# Patient Record
Sex: Male | Born: 2000 | Race: White | Hispanic: Yes | Marital: Single | Smoking: Never smoker
Health system: Southern US, Community
[De-identification: ages and names within clinical notes are randomized; demographics above are authoritative.]

---

## 2020-06-13 ENCOUNTER — Encounter (HOSPITAL_COMMUNITY): Payer: Self-pay | Admitting: *Deleted

## 2020-06-13 ENCOUNTER — Emergency Department (HOSPITAL_COMMUNITY): Payer: Medicaid Other

## 2020-06-13 ENCOUNTER — Emergency Department (HOSPITAL_COMMUNITY)
Admission: EM | Admit: 2020-06-13 | Discharge: 2020-06-13 | Disposition: A | Discharge status: Home / Self Care | Payer: Medicaid Other | Attending: Emergency Medicine | Admitting: Emergency Medicine

## 2020-06-13 DIAGNOSIS — R0789 Other chest pain: Secondary | ICD-10-CM

## 2020-06-13 DIAGNOSIS — X58XXXA Exposure to other specified factors, initial encounter: Secondary | ICD-10-CM | POA: Insufficient documentation

## 2020-06-13 DIAGNOSIS — S2232XA Fracture of one rib, left side, initial encounter for closed fracture: Secondary | ICD-10-CM | POA: Insufficient documentation

## 2020-06-13 DIAGNOSIS — R1012 Left upper quadrant pain: Secondary | ICD-10-CM | POA: Insufficient documentation

## 2020-06-13 DIAGNOSIS — S299XXA Unspecified injury of thorax, initial encounter: Secondary | ICD-10-CM | POA: Diagnosis present

## 2020-06-13 LAB — CBC WITH DIFFERENTIAL/PLATELET
Abs Immature Granulocytes: 0.02 10*3/uL (ref 0.00–0.07)
Basophils Absolute: 0 10*3/uL (ref 0.0–0.1)
Basophils Relative: 1 %
Eosinophils Absolute: 0.1 10*3/uL (ref 0.0–0.5)
Eosinophils Relative: 2 %
HCT: 48.1 % (ref 39.0–52.0)
Hemoglobin: 16.2 g/dL (ref 13.0–17.0)
Immature Granulocytes: 0 %
Lymphocytes Relative: 36 %
Lymphs Abs: 2.3 10*3/uL (ref 0.7–4.0)
MCH: 31.5 pg (ref 26.0–34.0)
MCHC: 33.7 g/dL (ref 30.0–36.0)
MCV: 93.6 fL (ref 80.0–100.0)
Monocytes Absolute: 0.5 10*3/uL (ref 0.1–1.0)
Monocytes Relative: 8 %
Neutro Abs: 3.3 10*3/uL (ref 1.7–7.7)
Neutrophils Relative %: 53 %
Platelets: 293 10*3/uL (ref 150–400)
RBC: 5.14 MIL/uL (ref 4.22–5.81)
RDW: 12.7 % (ref 11.5–15.5)
WBC: 6.3 10*3/uL (ref 4.0–10.5)
nRBC: 0 % (ref 0.0–0.2)

## 2020-06-13 LAB — COMPREHENSIVE METABOLIC PANEL
ALT: 14 U/L (ref 0–44)
AST: 18 U/L (ref 15–41)
Albumin: 4.9 g/dL (ref 3.5–5.0)
Alkaline Phosphatase: 94 U/L (ref 38–126)
Anion gap: 10 (ref 5–15)
BUN: 11 mg/dL (ref 6–20)
CO2: 26 mmol/L (ref 22–32)
Calcium: 9.4 mg/dL (ref 8.9–10.3)
Chloride: 103 mmol/L (ref 98–111)
Creatinine, Ser: 0.77 mg/dL (ref 0.61–1.24)
GFR, Estimated: >60 mL/min (ref >60)
Glucose, Bld: 62 mg/dL — ABNORMAL LOW (ref 70–99)
Potassium: 3.9 mmol/L (ref 3.5–5.1)
Sodium: 139 mmol/L (ref 135–145)
Total Bilirubin: 0.5 mg/dL (ref 0.3–1.2)
Total Protein: 8.7 g/dL — ABNORMAL HIGH (ref 6.5–8.1)

## 2020-06-13 LAB — LIPASE, BLOOD: Lipase: 45 U/L (ref 11–51)

## 2020-06-13 LAB — URINALYSIS, ROUTINE W REFLEX MICROSCOPIC
Bilirubin Urine: NEGATIVE
Glucose, UA: NEGATIVE mg/dL
Hgb urine dipstick: NEGATIVE
Ketones, ur: NEGATIVE mg/dL
Leukocytes,Ua: NEGATIVE
Nitrite: NEGATIVE
Protein, ur: NEGATIVE mg/dL
Specific Gravity, Urine: 1.025 (ref 1.005–1.030)
pH: 7 (ref 5.0–8.0)

## 2020-06-13 LAB — D-DIMER, QUANTITATIVE: D-Dimer, Quant: <0.27 ug/mL-FEU (ref 0.00–0.50)

## 2020-06-13 MED ORDER — IBUPROFEN 800 MG PO TABS: 800.0000 mg | ORAL_TABLET | Freq: Three times a day (TID) | ORAL | 0 refills | Status: AC | PRN

## 2020-06-13 MED ORDER — DICLOFENAC SODIUM 1 % EX GEL: 2.0000 g | Freq: Four times a day (QID) | CUTANEOUS | 0 refills | Status: AC | PRN

## 2020-06-13 MED ORDER — KETOROLAC TROMETHAMINE 30 MG/ML IJ SOLN
30.0000 mg | Freq: Once | INTRAMUSCULAR | Status: AC
  Administered 2020-06-13: 30 mg via INTRAVENOUS
  Filled 2020-06-13: qty 1

## 2020-06-13 NOTE — ED Triage Notes (Signed)
Left rib cage pain x 2 weeks, worse with movement

## 2020-06-13 NOTE — Discharge Instructions (Addendum)
Your workup today showed that you have a fracture to one or more ribs.  Unfortunately this type of injury hurts but there is no way to fix it immediately; it must heal over time.  Be sure to take plenty of deep breaths so that you get rid of the "bad air" in your lungs.  If you are given a device called an incentive spirometer, please use it as recommended.  Unless you have been told by your doctor not to do so, we recommend you take ibuprofen 800 mg 3 times daily with meals for no more than 5 days.  You can also take Tylenol 1000 mg every 6 hours for pain.  Follow-up at the clinics or with the doctors described in this paperwork.  Return to the emergency department if he develop new or worsening symptoms that concern you.

## 2020-06-13 NOTE — ED Notes (Signed)
States he vapes 7-8 times a day 

## 2020-06-13 NOTE — ED Provider Notes (Signed)
Emergency Department Provider Note   I have reviewed the triage vital signs and the nursing notes.   HISTORY  Chief Complaint No chief complaint on file.   HPI James Casey is a 20 y.o. male presents to the emergency department for evaluation of pain in the left lower chest for the past 2 weeks.  Patient states that he initially had pain with reaching up for something and felt a "pop" in this area.  Has had intermittent pain worse with movement since that time.  Symptoms have worsened to some degree over the past several days. No fever or chills. No vomiting or diarrhea. No urinary symptoms. Denies feeling SOB but notes that pain is limiting movement to some degree. Has tried OTC meds without relief.    History reviewed. No pertinent past medical history.  There are no problems to display for this patient.   History reviewed. No pertinent surgical history.  Allergies Bee venom and Strawberry (diagnostic)  No family history on file.  Social History Social History   Tobacco Use  . Smoking status: Never Smoker  . Smokeless tobacco: Never Used  Vaping Use  . Vaping Use: Every day  Substance Use Topics  . Alcohol use: Never  . Drug use: Never    Review of Systems  Constitutional: No fever/chills Eyes: No visual changes. ENT: No sore throat. Cardiovascular: Positive left sided chest pain. Respiratory: Denies shortness of breath. Gastrointestinal: No abdominal pain.  No nausea, no vomiting.  No diarrhea.  No constipation. Genitourinary: Negative for dysuria. Musculoskeletal: Negative for back pain. Skin: Negative for rash. Neurological: Negative for headaches, focal weakness or numbness.  10-point ROS otherwise negative.  ____________________________________________   PHYSICAL EXAM:  VITAL SIGNS: ED Triage Vitals  Enc Vitals Group     BP 06/13/20 1658 (!) 85/37     Pulse Rate 06/13/20 1658 92     Resp 06/13/20 1658 18     Temp 06/13/20 1658 97.8 F  (36.6 C)     Temp Source 06/13/20 1658 Oral     SpO2 06/13/20 1658 100 %     Weight 06/13/20 1716 121 lb (54.9 kg)     Height 06/13/20 1700 5\' 6"  (1.676 m)   Constitutional: Alert and oriented. Well appearing and in no acute distress. Eyes: Conjunctivae are normal.  Head: Atraumatic. Nose: No congestion/rhinnorhea. Mouth/Throat: Mucous membranes are moist.   Neck: No stridor.   Cardiovascular: Normal rate, regular rhythm. Good peripheral circulation. Grossly normal heart sounds.   Respiratory: Normal respiratory effort.  No retractions. Lungs CTAB. Gastrointestinal: Soft with fairly minimal tenderness to palpation over the left upper abdomen. No distention.  Musculoskeletal: No lower extremity tenderness nor edema. No gross deformities of extremities.No gross focal neurologic deficits are appreciated. Tenderness to palpation over the left lateral and anterior chest wall. No crepitus. No bruising.  Neurologic:  Normal speech and language.  Skin:  Skin is warm, dry and intact. No rash noted.   ____________________________________________   LABS (all labs ordered are listed, but only abnormal results are displayed)  Labs Reviewed  URINALYSIS, ROUTINE W REFLEX MICROSCOPIC - Abnormal; Notable for the following components:      Result Value   APPearance CLOUDY (*)    All other components within normal limits  COMPREHENSIVE METABOLIC PANEL - Abnormal; Notable for the following components:   Glucose, Bld 62 (*)    Total Protein 8.7 (*)    All other components within normal limits  LIPASE, BLOOD  CBC WITH DIFFERENTIAL/PLATELET  D-DIMER, QUANTITATIVE   ____________________________________________  EKG   EKG Interpretation  Date/Time:  Tuesday June 13 2020 17:24:32 EDT Ventricular Rate:  100 PR Interval:    QRS Duration: 86 QT Interval:  329 QTC Calculation: 425 R Axis:   65 Text Interpretation: Sinus tachycardia RSR' in V1 or V2, probably normal variant  no old tracing for  comparison Confirmed by Alona Bene [phone]) on 06/13/2020 5:26:26 PM       ____________________________________________  RADIOLOGY  DG Ribs Unilateral W/Chest Left  Result Date: 06/13/2020 CLINICAL DATA:  Left-sided rib pain EXAM: LEFT RIBS AND CHEST - 3+ VIEW COMPARISON:  None. FINDINGS: Single-view chest demonstrates no acute opacity or pleural effusion. Normal cardiomediastinal silhouette. No pneumothorax. Possible acute nondisplaced left ninth lateral rib fracture IMPRESSION: Possible acute nondisplaced left ninth lateral rib fracture. Electronically Signed   By: Jasmine Pang M.D.   On: 06/13/2020 18:12    ____________________________________________   PROCEDURES  Procedure(s) performed:   Procedures  None  ____________________________________________   INITIAL IMPRESSION / ASSESSMENT AND PLAN / ED COURSE  Pertinent labs & imaging results that were available during my care of the patient were reviewed by me and considered in my medical decision making (see chart for details).   Patient presents to the emergency department with atraumatic chest pain for the past 2 weeks.  Some tenderness on exam.  Lung sounds are equal.  Low suspicion for pneumothorax or fracture but with some focal, reproducible tenderness plan to start with plain films of the left ribs and chest.  Considered PE as a diagnosis as well and will screen with labs including D-dimer.  Not severely tender in the left upper quadrant but will obtain lipase as well as CMP and CBC.   Lab work reviewed and overall reassuring.  The patient's D-dimer is normal.  His x-ray shows a likely nondisplaced rib fracture at the area of pain and tenderness on exam.  He is not hypoxemic.  His blood pressures are low but he is a young, healthy, thin person.  EKG reviewed showing no acute findings as interpreted above by me.  Plan for pain control and incentive spirometry.  Machine provided in the ED. Discussed PCP follow up plan and ED  return precautions.  ____________________________________________  FINAL CLINICAL IMPRESSION(S) / ED DIAGNOSES  Final diagnoses:  Chest wall pain  Left upper quadrant abdominal pain  Closed fracture of one rib of left side, initial encounter     MEDICATIONS GIVEN DURING THIS VISIT:  Medications  ketorolac (TORADOL) 30 MG/ML injection 30 mg (30 mg Intravenous Given 06/13/20 1719)     NEW OUTPATIENT MEDICATIONS STARTED DURING THIS VISIT:  Discharge Medication List as of 06/13/2020  7:31 PM    START taking these medications   Details  diclofenac Sodium (VOLTAREN) 1 % GEL Apply 2 g topically 4 (four) times daily as needed., Starting Tue 06/13/2020, Normal    ibuprofen (ADVIL) 800 MG tablet Take 1 tablet (800 mg total) by mouth every 8 (eight) hours as needed for moderate pain., Starting Tue 06/13/2020, Normal        Note:  This document was prepared using Dragon voice recognition software and may include unintentional dictation errors.  Alona Bene, MD, [facility]    Freada Twersky, Arlyss Repress, MD 06/13/20 2232

## 2020-06-13 NOTE — Progress Notes (Signed)
Pt instructed on and informed of  the importance of IS and pt had very good effort. No questions asked

## 2022-02-13 IMAGING — DX DG RIBS W/ CHEST 3+V*L*
4 series · 4 of 4 positions shown · non-contrast
Comparison: None.

CLINICAL DATA: Left-sided rib pain

EXAM:
LEFT RIBS AND CHEST - 3+ VIEW

[chest pa]
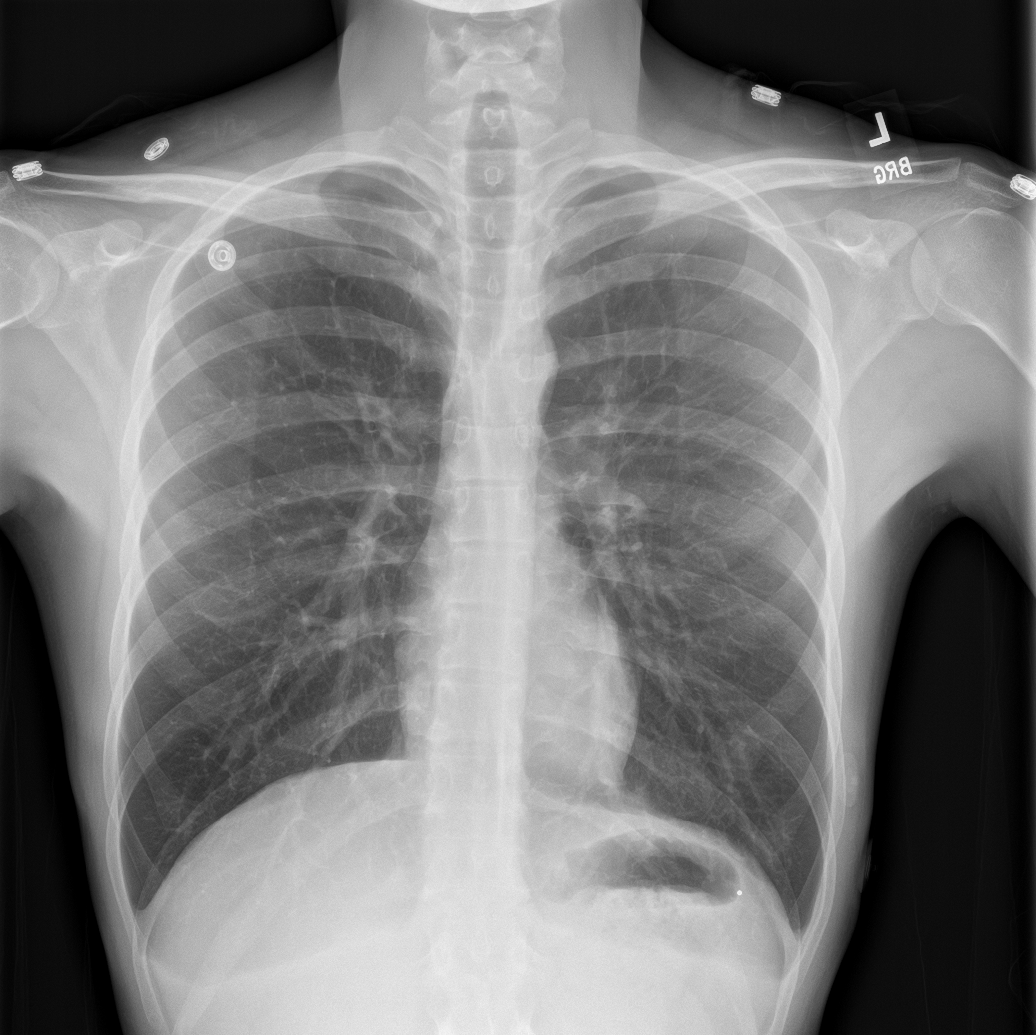

[rib pa obl (1 of 2)]
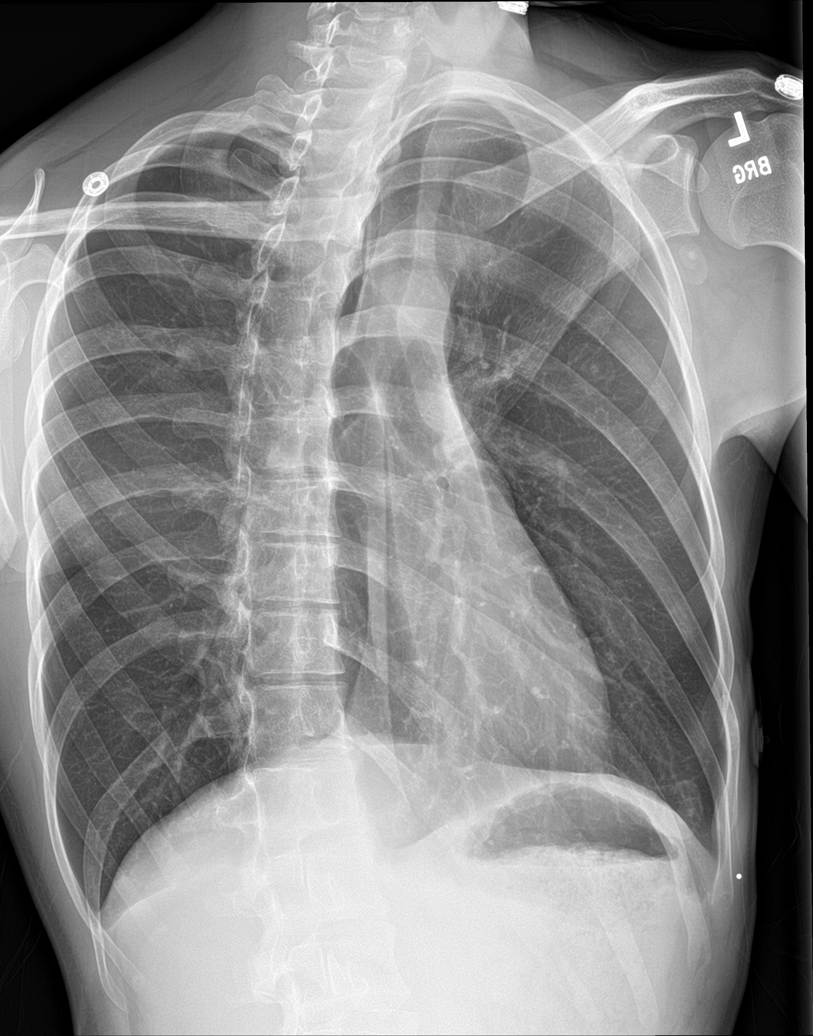

[rib pa obl (2 of 2)]
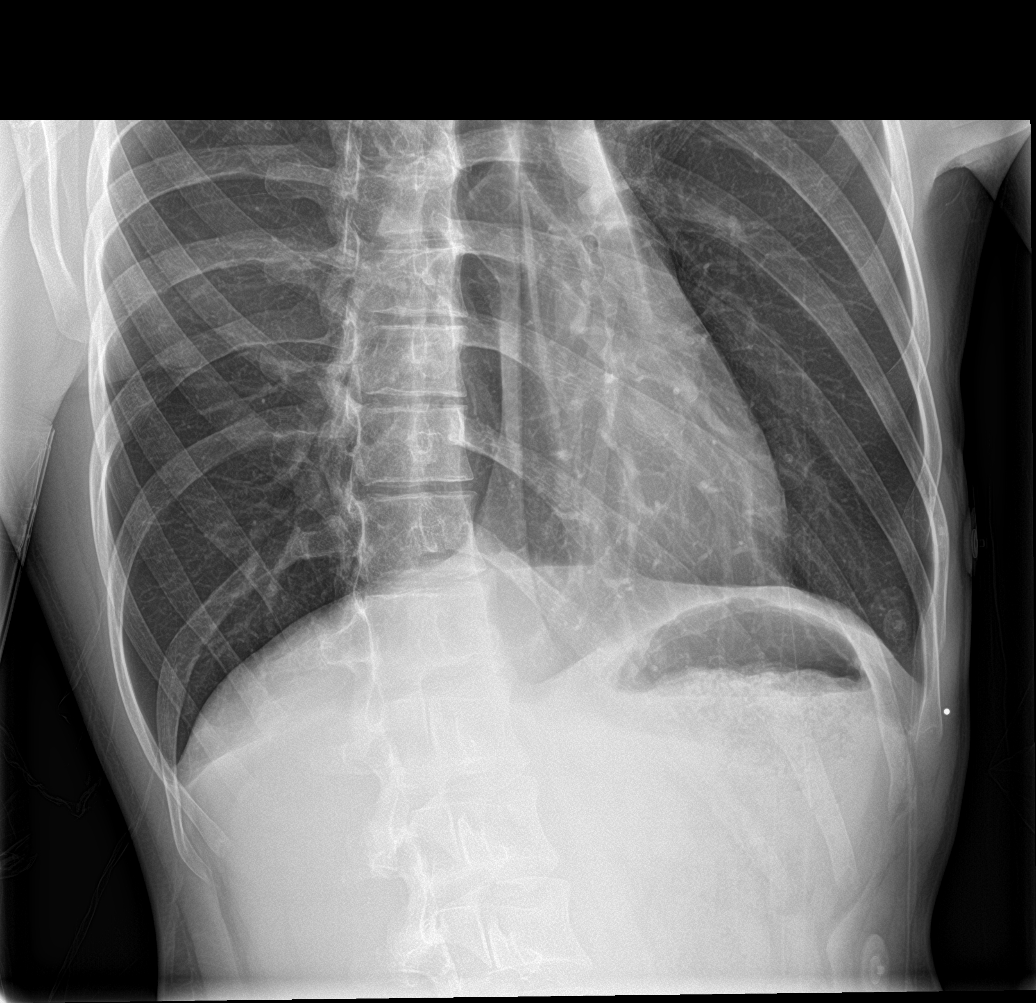

[rib pa]
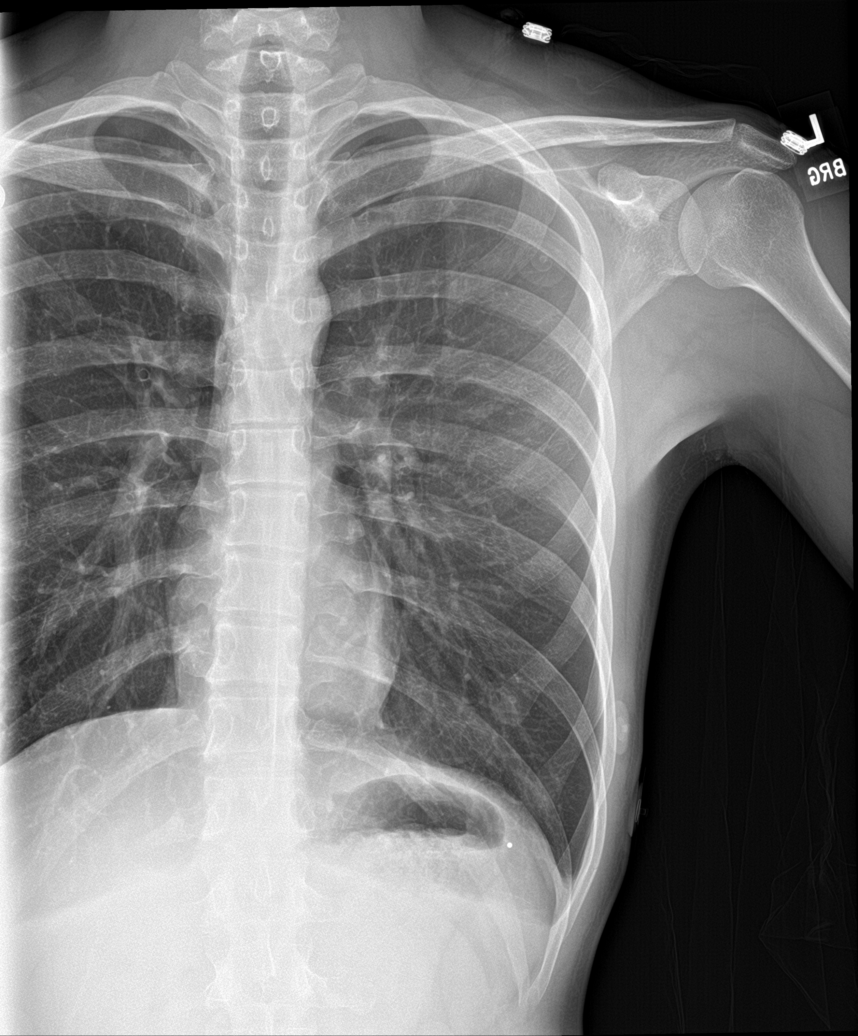

[4 of 4 positions shown; findings below may reference images not displayed]

FINDINGS: Single-view chest demonstrates no acute opacity or pleural effusion.
Normal cardiomediastinal silhouette. No pneumothorax.

Possible acute nondisplaced left ninth lateral rib fracture
IMPRESSION: Possible acute nondisplaced left ninth lateral rib fracture.
